# Patient Record
Sex: Female | Born: 1970 | Race: White | Hispanic: No | Marital: Married | Smoking: Never smoker
Health system: Southern US, Community
[De-identification: ages and names within clinical notes are randomized; demographics above are authoritative.]

## PROBLEM LIST (undated history)

## (undated) DIAGNOSIS — N809 Endometriosis, unspecified: Secondary | ICD-10-CM

## (undated) DIAGNOSIS — T7840XA Allergy, unspecified, initial encounter: Secondary | ICD-10-CM

## (undated) DIAGNOSIS — D649 Anemia, unspecified: Secondary | ICD-10-CM

## (undated) DIAGNOSIS — F419 Anxiety disorder, unspecified: Secondary | ICD-10-CM

## (undated) DIAGNOSIS — K219 Gastro-esophageal reflux disease without esophagitis: Secondary | ICD-10-CM

## (undated) HISTORY — DX: Anemia, unspecified: D64.9

## (undated) HISTORY — DX: Anxiety disorder, unspecified: F41.9

## (undated) HISTORY — PX: DILATION AND CURETTAGE OF UTERUS: SHX78

## (undated) HISTORY — PX: TONSILLECTOMY: SUR1361

## (undated) HISTORY — DX: Gastro-esophageal reflux disease without esophagitis: K21.9

## (undated) HISTORY — DX: Allergy, unspecified, initial encounter: T78.40XA

---

## 2005-01-03 ENCOUNTER — Emergency Department: Payer: Self-pay | Admitting: General Practice

## 2006-05-15 ENCOUNTER — Ambulatory Visit: Payer: Self-pay | Admitting: Unknown Physician Specialty

## 2007-03-24 ENCOUNTER — Emergency Department: Payer: Self-pay | Admitting: Emergency Medicine

## 2007-09-30 ENCOUNTER — Ambulatory Visit: Payer: Self-pay

## 2008-02-06 ENCOUNTER — Emergency Department: Payer: Self-pay | Admitting: Emergency Medicine

## 2012-10-30 ENCOUNTER — Emergency Department: Payer: Self-pay | Admitting: Emergency Medicine

## 2012-11-07 ENCOUNTER — Ambulatory Visit: Payer: Self-pay | Admitting: Physician Assistant

## 2012-11-07 LAB — CBC WITH DIFFERENTIAL/PLATELET
Basophil #: 0 10*3/uL (ref 0.0–0.1)
Basophil %: 0.2 %
Eosinophil #: 0 10*3/uL (ref 0.0–0.7)
HGB: 13.3 g/dL (ref 12.0–16.0)
Lymphocyte #: 0.4 10*3/uL — ABNORMAL LOW (ref 1.0–3.6)
Lymphocyte %: 14.3 %
MCH: 28 pg (ref 26.0–34.0)
MCV: 84 fL (ref 80–100)
Neutrophil #: 2.1 10*3/uL (ref 1.4–6.5)
Neutrophil %: 73.7 %
Platelet: 147 10*3/uL — ABNORMAL LOW (ref 150–440)
RBC: 4.75 10*6/uL (ref 3.80–5.20)
WBC: 2.9 10*3/uL — ABNORMAL LOW (ref 3.6–11.0)

## 2016-01-26 ENCOUNTER — Ambulatory Visit
Admission: EM | Admit: 2016-01-26 | Discharge: 2016-01-26 | Disposition: A | Discharge status: Home / Self Care | Payer: No Typology Code available for payment source | Attending: Family Medicine | Admitting: Family Medicine

## 2016-01-26 ENCOUNTER — Encounter: Payer: Self-pay | Admitting: Emergency Medicine

## 2016-01-26 ENCOUNTER — Ambulatory Visit (INDEPENDENT_AMBULATORY_CARE_PROVIDER_SITE_OTHER): Payer: No Typology Code available for payment source

## 2016-01-26 DIAGNOSIS — R109 Unspecified abdominal pain: Secondary | ICD-10-CM | POA: Diagnosis not present

## 2016-01-26 HISTORY — DX: Endometriosis, unspecified: N80.9

## 2016-01-26 LAB — BASIC METABOLIC PANEL
Anion gap: 5 (ref 5–15)
BUN: 13 mg/dL (ref 6–20)
CALCIUM: 8.3 mg/dL — AB (ref 8.9–10.3)
CO2: 25 mmol/L (ref 22–32)
CREATININE: 0.64 mg/dL (ref 0.44–1.00)
Chloride: 103 mmol/L (ref 101–111)
GFR calc non Af Amer: >60 mL/min (ref >60)
Glucose, Bld: 104 mg/dL — ABNORMAL HIGH (ref 65–99)
Potassium: 3.8 mmol/L (ref 3.5–5.1)
Sodium: 133 mmol/L — ABNORMAL LOW (ref 135–145)

## 2016-01-26 LAB — CBC WITH DIFFERENTIAL/PLATELET
BASOS PCT: 1 %
Basophils Absolute: 0.1 10*3/uL (ref 0–0.1)
EOS ABS: 0.1 10*3/uL (ref 0–0.7)
Eosinophils Relative: 1 %
HEMATOCRIT: 38.3 % (ref 35.0–47.0)
Hemoglobin: 13 g/dL (ref 12.0–16.0)
Lymphocytes Relative: 23 %
Lymphs Abs: 2.3 10*3/uL (ref 1.0–3.6)
MCH: 28.6 pg (ref 26.0–34.0)
MCHC: 34 g/dL (ref 32.0–36.0)
MCV: 84.3 fL (ref 80.0–100.0)
MONO ABS: 0.7 10*3/uL (ref 0.2–0.9)
MONOS PCT: 7 %
NEUTROS ABS: 6.8 10*3/uL — AB (ref 1.4–6.5)
Neutrophils Relative %: 68 %
Platelets: 286 10*3/uL (ref 150–440)
RBC: 4.54 MIL/uL (ref 3.80–5.20)
RDW: 13.7 % (ref 11.5–14.5)
WBC: 9.9 10*3/uL (ref 3.6–11.0)

## 2016-01-26 LAB — URINALYSIS COMPLETE WITH MICROSCOPIC (ARMC ONLY)
Bacteria, UA: NONE SEEN
Bilirubin Urine: NEGATIVE
Glucose, UA: NEGATIVE mg/dL
KETONES UR: NEGATIVE mg/dL
LEUKOCYTES UA: NEGATIVE
Nitrite: NEGATIVE
PH: 6.5 (ref 5.0–8.0)
PROTEIN: NEGATIVE mg/dL
Specific Gravity, Urine: 1.02 (ref 1.005–1.030)

## 2016-01-26 MED ORDER — CYCLOBENZAPRINE HCL 5 MG PO TABS: 5.0000 mg | ORAL_TABLET | Freq: Three times a day (TID) | ORAL | Status: DC | PRN

## 2016-01-26 MED ORDER — KETOROLAC TROMETHAMINE 60 MG/2ML IM SOLN
60.0000 mg | Freq: Once | INTRAMUSCULAR | Status: AC
  Administered 2016-01-26: 60 mg via INTRAMUSCULAR

## 2016-01-26 NOTE — ED Notes (Signed)
Patient c/o pain in her left side of her abdomen that goes into her back that started yesterday.  Patient denies fevers.

## 2016-01-26 NOTE — ED Provider Notes (Signed)
[facility]  ____________________________________________  Time seen: Approximately 11:26 AM  I have reviewed the triage vital signs and the nursing notes.   HISTORY  Chief Complaint Abdominal Pain and Back Pain  HPI Brenda Giles is a 45 y.o. female  presents with a complaint of left flank pain. Patient reports pains has been present since yesterday. Patient states that the pain is primarily to left back, left side. States occasionally she'll feel a pain that radiates towards left groin. Denies any other accompanied symptoms. Denies nausea, vomiting, diarrhea, fevers, urinary frequency, urinary urgency, dysuria or vaginal complaints. States has not taken any over-the-counter medications for the same.  Denies known fall or known injury. Patient states that aggravating factors include movement. Patient states that the pain at baseline is a dull achy pain at 2 out of 10 but with intermittent sharp stabbing pain at most 6 out of 10. Patient states that the sharp stabbing pain is only present with movement. Patient states that if sitting still or lying still she does not have any sharp stabbing pains. Patient states bending and twisting causes increased pain. Patient again denies fall or trauma. 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  Denies chest pain, shortness of breath, dizziness, weakness, hearing changes, abdominal pain, dysuria, hematuria, neck pain, back pain, extremity pain or extremity swelling. Denies recent sickness. Denies blood in stool, dark colored stool or blood in toilet. Denies hematuria.   Patient's last menstrual period was 01/21/2016 (exact date). Denies chance of pregnancy.   PCP: [facility].    Past Medical History  Diagnosis Date  . Endometriosis     There are no active  problems to display for this patient.   Past Surgical History  Procedure Laterality Date  . Dilation and curettage of uterus    . Tonsillectomy      Current Outpatient Rx  Name  Route  Sig  Dispense  Refill  . levonorgestrel (MIRENA) 20 MCG/24HR IUD   Intrauterine   1 each by Intrauterine route once.         . vitamin B-12 (CYANOCOBALAMIN) 1000 MCG tablet   Oral   Take 3,000 mcg by mouth daily.         . vitamin C (ASCORBIC ACID) 500 MG tablet   Oral   Take 500 mg by mouth daily.           Allergies Penicillins  History reviewed. No pertinent family history.  Social History Social History  Substance Use Topics  . Smoking status: Never Smoker   . Smokeless tobacco: None  . Alcohol Use: No    Review of Systems Constitutional: No fever/chills Eyes: No visual changes. ENT: No sore throat. Cardiovascular: Denies chest pain. Respiratory: Denies shortness of breath. Gastrointestinal: No abdominal pain.  No nausea, no vomiting.  No diarrhea.  No constipation. Genitourinary: Negative for dysuria. Musculoskeletal: Positive left flank pain.  Skin: Negative for rash. Neurological: Negative for headaches, focal weakness or numbness.  10-point ROS otherwise negative.  ____________________________________________   PHYSICAL EXAM:  VITAL SIGNS: ED Triage Vitals  Enc Vitals Group     BP 01/26/16 1102 115/87 mmHg     Pulse Rate 01/26/16 1102 84     Resp 01/26/16 1102 16     Temp 01/26/16 1102 97.6 F (36.4  C)     Temp Source 01/26/16 1102 Tympanic     SpO2 01/26/16 1102 100 %     Weight 01/26/16 1102 210 lb (95.255 kg)     Height 01/26/16 1102 5\' 5"  (1.651 m)     Head Cir --      Peak Flow --      Pain Score 01/26/16 1105 5     Pain Loc --      Pain Edu? --      Excl. in [place]? --     Constitutional: Alert and oriented. Well appearing and in no acute distress. Eyes: Conjunctivae are normal. PERRL. EOMI. Head: Atraumatic.  Nose: No  congestion/rhinnorhea.  Mouth/Throat: Mucous membranes are moist.  Oropharynx non-erythematous.No tonsillar swelling or exudate. Neck: No stridor.  No cervical spine tenderness to palpation. Hematological/Lymphatic/Immunilogical: No cervical lymphadenopathy. Cardiovascular: Normal rate, regular rhythm. Grossly normal heart sounds.  Good peripheral circulation. Respiratory: Normal respiratory effort.  No retractions. Lungs CTAB. Gastrointestinal: Soft and nontender. Obese abdomen. Normal Bowel sounds.  No right CVA tenderness. Mild left CVA tenderness to palpation,  Musculoskeletal: No lower or upper extremity tenderness nor edema.  No midline cervical, thoracic or lumbar tenderness to palpation. Bilateral pedal pulses equal and easily palpated. left lower lumbar flank mild tenderness to palpation with pain increased with left and right rotation and bending, and patient reports pain is fully reproducible with these movements. Straight leg raises negative bilaterally. Changes positions in room quickly.  Neurologic:  Normal speech and language. No gross focal neurologic deficits are appreciated. No gait instability. Skin:  Skin is warm, dry and intact. No rash noted. Psychiatric: Mood and affect are normal. Speech and behavior are normal.  ____________________________________________   LABS (all labs ordered are listed, but only abnormal results are displayed)  Labs Reviewed  URINALYSIS COMPLETEWITH MICROSCOPIC ([place]) - Abnormal; Notable for the following:    Hgb urine dipstick 2+ (*)    Squamous Epithelial / LPF 0-5 (*)    All other components within normal limits  CBC WITH DIFFERENTIAL/PLATELET - Abnormal; Notable for the following:    Neutro Abs 6.8 (*)    All other components within normal limits  BASIC METABOLIC PANEL - Abnormal; Notable for the following:    Sodium 133 (*)    Glucose, Bld 104 (*)    Calcium 8.3 (*)    All other components within normal limits  URINE CULTURE     RADIOLOGY  EXAM: ABDOMEN - 1 VIEW  COMPARISON: None.  FINDINGS: The bowel gas pattern is normal. Intrauterine device is noted. No renal calculi are noted. Phlebolith is seen in the left side of the pelvis.  IMPRESSION: No evidence of bowel obstruction or ileus. No definite nephrolithiasis is noted.   Electronically Signed By: Marijo Conception, M.D. On: 01/26/2016 12:01  I, Marylene Land, personally viewed and evaluated these images (plain radiographs) as part of my medical decision making, as well as reviewing the written report by the radiologist.    [place] [facility] [place] / ED COURSE  Pertinent labs & imaging results that were available during my care of the patient were reviewed by me and considered in my medical decision making (see chart for details).  Very well-appearing patient. No acute distress. Presents for the complaints of left back pain since yesterday. Denies fall or trauma. Does report some back straining when helping a foster child. Denies fevers or abdominal pain or dysuria. Patient left lower lumbar flank mild tenderness to palpation  with pain increased with left and right rotation and bending, and patient reports pain is fully reproducible with these movements. States minimal pain when sitting still at rest. Suspect muscular strain. Discussed treatment and evaluation with patient patient opts to go forth with evaluation of CBC, BMP, urinalysis and KUB.Toradol 60 mg IM once.   Labs reviewed. WBC count normal.KUB no nephrolithiasis. Suspect muscular strain. Will treat with stretching, ice/heat, rest, and prn flexeril. Patient states the pain is only with movement at this time and mild. Patient states that pain has improved with IM Toradol. Discussed follow up with Primary care physician this week. Discussed follow up and return parameters including no resolution or any worsening concerns. Patient verbalized understanding and agreed to  plan. Discussed indication, risks and benefits of medications with patient.   ____________________________________________   FINAL CLINICAL IMPRESSION(S) / ED DIAGNOSES  Final diagnoses:  Left flank pain      Note: This dictation was prepared with Dragon dictation along with smaller phrase technology. Any transcriptional errors that result from this process are unintentional.    Marylene Land, NP 01/31/16 0920  Marylene Land, NP 01/31/16 1000

## 2016-01-26 NOTE — Discharge Instructions (Signed)
Take medication as prescribed. Rest. Drink plenty of fluids. Stretch. Alternate heat and ice for comfort.    Follow up with your primary care physician this week as needed. Return to Urgent care for new or worsening concerns.    Flank Pain Flank pain refers to pain that is located on the side of the body between the upper abdomen and the back. The pain may occur over a short period of time (acute) or may be long-term or reoccurring (chronic). It may be mild or severe. Flank pain can be caused by many things. CAUSES  Some of the more common causes of flank pain include:  Muscle strains.   Muscle spasms.   A disease of your spine (vertebral disk disease).   A lung infection (pneumonia).   Fluid around your lungs (pulmonary edema).   A kidney infection.   Kidney stones.   A very painful skin rash caused by the chickenpox virus (shingles).   Gallbladder disease.  [place] care will depend on the cause of your pain. In general,  Rest as directed by your caregiver.  Drink enough fluids to keep your urine clear or pale yellow.  Only take over-the-counter or prescription medicines as directed by your caregiver. Some medicines may help relieve the pain.  Tell your caregiver about any changes in your pain.  Follow up with your caregiver as directed. SEEK IMMEDIATE MEDICAL CARE IF:   Your pain is not controlled with medicine.   You have new or worsening symptoms.  Your pain increases.   You have abdominal pain.   You have shortness of breath.   You have persistent nausea or vomiting.   You have swelling in your abdomen.   You feel faint or pass out.   You have blood in your urine.  You have a fever or persistent symptoms for more than 2-3 days.  You have a fever and your symptoms suddenly get worse. MAKE SURE YOU:   Understand these instructions.  Will watch your condition.  Will get help right away if you are not doing well  or get worse.   This information is not intended to replace advice given to you by your health care provider. Make sure you discuss any questions you have with your health care provider.   Document Released: 12/19/2005 Document Revised: 07/22/2012 Document Reviewed: 06/11/2012 Elsevier Interactive Patient Education [employer].

## 2016-07-06 ENCOUNTER — Encounter: Payer: Self-pay | Admitting: Gynecology

## 2016-07-06 ENCOUNTER — Ambulatory Visit
Admission: EM | Admit: 2016-07-06 | Discharge: 2016-07-06 | Disposition: A | Discharge status: Home / Self Care | Payer: No Typology Code available for payment source | Attending: Family Medicine | Admitting: Family Medicine

## 2016-07-06 DIAGNOSIS — L03115 Cellulitis of right lower limb: Secondary | ICD-10-CM | POA: Diagnosis not present

## 2016-07-06 DIAGNOSIS — T148 Other injury of unspecified body region: Secondary | ICD-10-CM

## 2016-07-06 DIAGNOSIS — W57XXXA Bitten or stung by nonvenomous insect and other nonvenomous arthropods, initial encounter: Secondary | ICD-10-CM

## 2016-07-06 MED ORDER — DOXYCYCLINE HYCLATE 100 MG PO CAPS: 100.0000 mg | ORAL_CAPSULE | Freq: Two times a day (BID) | ORAL | 0 refills | Status: DC

## 2016-07-06 NOTE — ED Triage Notes (Signed)
Patient c/o tick bite on right ankle. Per patient blister at bite area.

## 2016-07-06 NOTE — ED Provider Notes (Signed)
CSN: [id number]     Arrival date & time 07/06/16  1356 History   First MD Initiated Contact with Patient 07/06/16 1420     Chief Complaint  Patient presents with  . Insect Bite   (Consider location/radiation/quality/duration/timing/severity/associated sxs/prior Treatment) HPI the patient presents today for evaluation of a right lower extremity tick bite. The tick bite occurred 2 days ago, she was walking when she felt the bite and removed the tick, she states that the ticket was not embedded in her skin. Since then she has developed to small areas of what appears to be a raised blister. Denies any numbness or tingling to the right lower extremity. Denies any joint pain, mild stiffness in the right ankle. Denies any personal history of Lyme disease. Past Medical History:  Diagnosis Date  . Endometriosis    Past Surgical History:  Procedure Laterality Date  . DILATION AND CURETTAGE OF UTERUS    . TONSILLECTOMY     No family history on file. Social History  Substance Use Topics  . Smoking status: Never Smoker  . Smokeless tobacco: Never Used  . Alcohol use No   OB History    No data available     Review of Systems  Constitutional: Negative for fever.  HENT: Negative.   Eyes: Negative.   Respiratory: Negative.   Cardiovascular: Negative.   Gastrointestinal: Negative.   Endocrine: Negative.   Genitourinary: Negative.   Musculoskeletal: Positive for joint swelling.  Skin: Positive for wound.  Allergic/Immunologic: Negative.   Neurological: Negative.   Hematological: Negative.   Psychiatric/Behavioral: Negative.     Allergies  Penicillins  Home Medications   Prior to Admission medications   Medication Sig Start Date End Date Taking? Authorizing Provider  levonorgestrel (MIRENA) 20 MCG/24HR IUD 1 each by Intrauterine route once.   Yes Historical Provider, MD  vitamin B-12 (CYANOCOBALAMIN) 1000 MCG tablet Take 3,000 mcg by mouth daily.   Yes Historical Provider, MD    vitamin C (ASCORBIC ACID) 500 MG tablet Take 500 mg by mouth daily.   Yes Historical Provider, MD  cyclobenzaprine (FLEXERIL) 5 MG tablet Take 1 tablet (5 mg total) by mouth every 8 (eight) hours as needed for muscle spasms (or pain. Do not drive or operate machinery while taking as can cause drowsiness.). 01/26/16   Marylene Land, NP  doxycycline (VIBRAMYCIN) 100 MG capsule Take 1 capsule (100 mg total) by mouth 2 (two) times daily. 07/06/16   Lattie Corns, PA-C   Meds Ordered and Administered this Visit  Medications - No data to display  BP 103/65 (BP Location: Left Arm)   Pulse 83   Temp 98.6 F (37 C) (Tympanic)   Resp 16   Ht 5\' 5"  (1.651 m)   Wt 210 lb (95.3 kg)   LMP 06/25/2016   SpO2 100%   BMI 34.95 kg/m  No data found.   Physical Exam  Constitutional: She appears well-developed and well-nourished. No distress.  Musculoskeletal:       Right ankle: She exhibits normal range of motion. No tenderness. No lateral malleolus and no medial malleolus tenderness found.  Skin: She is not diaphoretic.       Urgent Care Course   Clinical Course    Procedures (including critical care time)  Labs Review Labs Reviewed - No data to display  Imaging Review No results found.  MDM   1. Tick bite   2. Cellulitis of right lower extremity    1.At today's visit, the location does  not appear infected.  Two small pustular areas without drainage or surrounding erythema.  She was given a prescription for doxycycline in case she notices increased redness or drainage. 2.  Encouraged ice and hydrocortisone cream as needed for itching. 3.  If symptoms worsen or persist she can follow-up here at the [facility] or with her PCP.    Lattie Corns, [place] 07/06/16 [phone]

## 2018-05-07 DIAGNOSIS — N943 Premenstrual tension syndrome: Secondary | ICD-10-CM | POA: Insufficient documentation

## 2018-05-07 DIAGNOSIS — Z8739 Personal history of other diseases of the musculoskeletal system and connective tissue: Secondary | ICD-10-CM | POA: Insufficient documentation

## 2018-05-07 DIAGNOSIS — E66811 Obesity, class 1: Secondary | ICD-10-CM | POA: Insufficient documentation

## 2019-03-04 ENCOUNTER — Ambulatory Visit (INDEPENDENT_AMBULATORY_CARE_PROVIDER_SITE_OTHER)
Admit: 2019-03-04 | Discharge: 2019-03-04 | Disposition: A | Discharge status: Home / Self Care | Payer: 59 | Attending: Urgent Care | Admitting: Urgent Care

## 2019-03-04 ENCOUNTER — Other Ambulatory Visit: Payer: Self-pay

## 2019-03-04 ENCOUNTER — Encounter: Payer: Self-pay | Admitting: Emergency Medicine

## 2019-03-04 ENCOUNTER — Ambulatory Visit
Admission: EM | Admit: 2019-03-04 | Discharge: 2019-03-04 | Disposition: A | Discharge status: Home / Self Care | Payer: 59

## 2019-03-04 DIAGNOSIS — R1011 Right upper quadrant pain: Secondary | ICD-10-CM

## 2019-03-04 DIAGNOSIS — R17 Unspecified jaundice: Secondary | ICD-10-CM

## 2019-03-04 DIAGNOSIS — K219 Gastro-esophageal reflux disease without esophagitis: Secondary | ICD-10-CM | POA: Diagnosis not present

## 2019-03-04 DIAGNOSIS — K8020 Calculus of gallbladder without cholecystitis without obstruction: Secondary | ICD-10-CM | POA: Diagnosis not present

## 2019-03-04 LAB — CBC
HCT: 38.5 % (ref 36.0–46.0)
Hemoglobin: 13 g/dL (ref 12.0–15.0)
MCH: 29.4 pg (ref 26.0–34.0)
MCHC: 33.8 g/dL (ref 30.0–36.0)
MCV: 87.1 fL (ref 80.0–100.0)
Platelets: 344 10*3/uL (ref 150–400)
RBC: 4.42 MIL/uL (ref 3.87–5.11)
RDW: 12.9 % (ref 11.5–15.5)
WBC: 8.5 10*3/uL (ref 4.0–10.5)
nRBC: 0 % (ref 0.0–0.2)

## 2019-03-04 LAB — COMPREHENSIVE METABOLIC PANEL
ALT: 14 U/L (ref 0–44)
AST: 20 U/L (ref 15–41)
Albumin: 4.1 g/dL (ref 3.5–5.0)
Alkaline Phosphatase: 41 U/L (ref 38–126)
Anion gap: 9 (ref 5–15)
BUN: 8 mg/dL (ref 6–20)
CO2: 22 mmol/L (ref 22–32)
Calcium: 9 mg/dL (ref 8.9–10.3)
Chloride: 105 mmol/L (ref 98–111)
Creatinine, Ser: 0.77 mg/dL (ref 0.44–1.00)
GFR calc Af Amer: >60 mL/min (ref >60)
GFR calc non Af Amer: >60 mL/min (ref >60)
Glucose, Bld: 106 mg/dL — ABNORMAL HIGH (ref 70–99)
Potassium: 3.7 mmol/L (ref 3.5–5.1)
Sodium: 136 mmol/L (ref 135–145)
Total Bilirubin: 1.4 mg/dL — ABNORMAL HIGH (ref 0.3–1.2)
Total Protein: 8.1 g/dL (ref 6.5–8.1)

## 2019-03-04 LAB — LIPASE, BLOOD: Lipase: 19 U/L (ref 11–51)

## 2019-03-04 MED ORDER — HYDROCODONE-ACETAMINOPHEN 5-325 MG PO TABS: 1.0000 | ORAL_TABLET | Freq: Three times a day (TID) | ORAL | 0 refills | Status: DC | PRN

## 2019-03-04 MED ORDER — ONDANSETRON 4 MG PO TBDP: 4.0000 mg | ORAL_TABLET | Freq: Three times a day (TID) | ORAL | 0 refills | Status: DC | PRN

## 2019-03-04 MED ORDER — PANTOPRAZOLE SODIUM 20 MG PO TBEC: 20.0000 mg | DELAYED_RELEASE_TABLET | Freq: Every day | ORAL | 0 refills | Status: DC

## 2019-03-04 NOTE — ED Provider Notes (Signed)
[address] [place], [place] [address] [phone]   Name: Brenda Giles DOB: [birth year] MRN: [id number] CSN: [id number]  Arrival date and time:  03/04/19 1537  Chief Complaint:  Abdominal Pain (RUQ)  NOTE: Prior to seeing the patient today, I have reviewed the triage nursing documentation and vital signs. Clinical staff has updated patient's PMH/PSHx, current medication list, and drug allergies/intolerances to ensure comprehensive history available to assist in medical decision making.   History:   HPI: Brenda Giles is a 48 y.o. female who presents today with complaints of RIGHY upper quadrant abdominal pain that began with acute onset x 2 days ago. Pain is through and through to her back. She has intermittent referred pain to her RIGHT shoulder. Patient states, "it is like someone took a pole and stuck it right through my upper abdomen". She is experiencing significant nausea with no associated vomiting. She has had a decreased appetite over the last 2 days. Patient last ate "5 saltine crackers and some diet ginger ale" at around 11:00 am today. Patient states, "I am hungry. I want to eat, but when I think about food it makes me sick". Patient denies fevers and changes to her bowel habits. She is unable to attribute symptoms to dietary intake. Additionally, patient notes increased acid reflux symptoms x 2 months.   PMH is not significant for any known gallbladder disease or hepatic pathology. She contacted her insurance provider line earlier today and was advised to present to an [facility] for further evaluation of possible cholecystitis.  Past Medical History:  Diagnosis Date   Endometriosis     Past Surgical History:  Procedure Laterality Date   DILATION AND CURETTAGE OF UTERUS     TONSILLECTOMY      Family History  Problem Relation Age of Onset   Hypertension Mother    Neuropathy Father    Hypertension Father     Social History   Socioeconomic  History   Marital status: Married    Spouse name: Not on file   Number of children: Not on file   Years of education: Not on file   Highest education level: Not on file  Occupational History   Not on file  Social Needs   Financial resource strain: Not on file   Food insecurity:    Worry: Not on file    Inability: Not on file   Transportation needs:    Medical: Not on file    Non-medical: Not on file  Tobacco Use   Smoking status: Never Smoker   Smokeless tobacco: Never Used  Substance and Sexual Activity   Alcohol use: Yes    Comment: occasionally   Drug use: Not Currently   Sexual activity: Yes    Birth control/protection: I.U.D.  Lifestyle   Physical activity:    Days per week: Not on file    Minutes per session: Not on file   Stress: Not on file  Relationships   Social connections:    Talks on phone: Not on file    Gets together: Not on file    Attends religious service: Not on file    Active member of club or organization: Not on file    Attends meetings of clubs or organizations: Not on file    Relationship status: Not on file   Intimate partner violence:    Fear of current or ex partner: Not on file    Emotionally abused: Not on file    Physically abused: Not on  file    Forced sexual activity: Not on file  Other Topics Concern   Not on file  Social History Narrative   Not on file    There are no active problems to display for this patient.   Home Medications:    Current Meds  Medication Sig   buPROPion (WELLBUTRIN XL) 150 MG 24 hr tablet Take by mouth.   Norethindrone-Ethinyl Estradiol-Fe Biphas (LO LOESTRIN FE) 1 MG-10 MCG / 10 MCG tablet Take 1 tablet by mouth daily.    Allergies:   Penicillins  Review of Systems (ROS): Review of Systems  Constitutional: Positive for appetite change (decreased). Negative for chills and fever.  Respiratory: Negative for cough and shortness of breath.   Cardiovascular: Negative for chest  pain and palpitations.  Gastrointestinal: Positive for abdominal pain and nausea. Negative for diarrhea and vomiting.  Skin: Negative for color change and rash.  Neurological: Negative for dizziness, weakness and headaches.  All other systems reviewed and are negative.    Physical Exam:  Triage Vital Signs ED Triage Vitals  Enc Vitals Group     BP 03/04/19 1552 120/73     Pulse Rate 03/04/19 1552 79     Resp 03/04/19 1552 18     Temp 03/04/19 1552 98.4 F (36.9 C)     Temp Source 03/04/19 1552 Oral     SpO2 03/04/19 1552 99 %     Weight 03/04/19 1547 215 lb (97.5 kg)     Height 03/04/19 1547 5\' 5"  (1.651 m)     Head Circumference --      Peak Flow --      Pain Score 03/04/19 1546 6     Pain Loc --      Pain Edu? --      Excl. in [place]? --     Physical Exam  Constitutional: She is oriented to person, place, and time and well-developed, well-nourished, and in no distress.  HENT:  Head: Normocephalic and atraumatic.  Mouth/Throat: Oropharynx is clear and moist and mucous membranes are normal.  Eyes: Pupils are equal, round, and reactive to light. EOM are normal.  Cardiovascular: Normal rate, regular rhythm, normal heart sounds and intact distal pulses. Exam reveals no gallop and no friction rub.  No murmur heard. Pulmonary/Chest: Effort normal and breath sounds normal. No respiratory distress. She has no wheezes. She has no rales.  Abdominal: Soft. Normal appearance and bowel sounds are normal. There is no hepatosplenomegaly. There is abdominal tenderness in the right upper quadrant. There is no rebound, no guarding, no CVA tenderness and negative Murphy's sign.  Neurological: She is alert and oriented to person, place, and time.  Skin: Skin is warm and dry. No rash noted. No erythema.  Psychiatric: Mood, affect and judgment normal.  Nursing note and vitals reviewed.    Urgent Care Treatments / Results:   LABS: PLEASE NOTE: all labs that were ordered this encounter are  listed, however only abnormal results are displayed. Labs Reviewed  COMPREHENSIVE METABOLIC PANEL - Abnormal; Notable for the following components:      Result Value   Glucose, Bld 106 (*)    Total Bilirubin 1.4 (*)    All other components within normal limits  LIPASE, BLOOD  CBC    EKG: No orders found for this or any previous visit.  RADIOLOGY: US Abdomen Limited Ruq  Result Date: 03/04/2019 CLINICAL DATA:  Right upper quadrant pain EXAM: ULTRASOUND ABDOMEN LIMITED RIGHT UPPER QUADRANT COMPARISON:  None. FINDINGS: Gallbladder:  Within the gallbladder, there is an echogenic focus which moves in shadows representing either one large or multiple adherent calculi. This gallstone or collection of gallstones measures 4.0 cm in length. There is no gallbladder wall thickening or pericholecystic fluid. No sonographic Murphy sign noted by sonographer. Common bile duct: Diameter: 4 mm. No intrahepatic or extrahepatic biliary duct dilatation. Liver: No focal lesion identified. Within normal limits in parenchymal echogenicity. Portal vein is patent on color Doppler imaging with normal direction of blood flow towards the liver. IMPRESSION: Cholelithiasis. No appreciable gallbladder wall thickening or pericholecystic fluid. Study otherwise unremarkable. Electronically Signed   By: Lowella Grip III M.D.   On: 03/04/2019 16:42   PRODEDURES: Procedures  MEDICATIONS RECEIVED THIS VISIT: Medications - No data to display Clinical Course as of Mar 03 1718  Thu Mar 04, 2019  1540 Distribution of her pain, coupled with the fact that pain radiates into her back and shoulder, gives rise to concern for gallbladder involvement. She does not have a fever, her VS are stable, and she is non-toxic appearing. She has the following pre-disposing risk factors for gallbladder disease: female sex, age over 10, obesity, and fair complexion. Will proceed with labs and ultrasound imaging.    [BG]    Clinical Course User  Index [BG] Karen Kitchens, NP   Initial Impression / Assessment and Plan / Urgent Care Course:   Pertinent labs & imaging results that were available during my care of the patient were personally reviewed by me and considered in my medical decision making (see chart for details).   Brenda Giles is a 48 y.o. female who presents to [facility] today with complaints of RIGHT upper quadrant abdominal pain that radiates into her back and shoulder. She has significant nausea with no associated vomiting. Appetite has been decreased. Patient has not mounted a fever response associated with her current symptoms.   Exam reveal RIGHT upper quadrant abdominal tenderness. CBC was normal. AST and ALT normal. Total bilirubin minimally elevated at 1.4 mg/dL. Ultrasound imaging was reassuring. She has no active cholecystitis. Wall thickness WNL at 1.9 mm, and there was no pericholecystic fluid collection. CBD measures 4 mm with no intrahepatic or extrahepatic biliary duct dilatation. Note was made of a 4 cm non-obstructing gallstone. Given her pain and nausea, will treat with Norco, ondansetron, and refer to surgery for further evaluation. Additionally, will start patient on low dose PPI (pantoprazole 20 mg) therapy daily to help with her reflux. Discussed that reflux may improved with treatment of cholelithiasis, otherwise she will need to follow up with her PCP for ongoing assessment and management.   Encouraged patient follow up with surgery this week for re-evaluation. Provided her with contact information for Dr. Benjamine Sprague at [facility]. I have reviewed the follow up and strict return precautions for any new or worsening symptoms. Patient is aware of symptoms that would be deemed urgent/emergent, and would thus require further evaluation either here or in the emergency department. At the time of discharge, she verbalized understanding and consent with the discharge plan as it was reviewed with her.  All questions were fielded by provider and/or clinic staff prior to patient discharge.    Final Clinical Impressions(s) / Urgent Care Diagnoses:   Final diagnoses:  RUQ pain  Calculus of gallbladder without cholecystitis without obstruction  Gastroesophageal reflux disease, esophagitis presence not specified  Total bilirubin, elevated    New Prescriptions:   Meds ordered this encounter  Medications  pantoprazole (PROTONIX) 20 MG tablet    Sig: Take 1 tablet (20 mg total) by mouth daily.    Dispense:  30 tablet    Refill:  0   HYDROcodone-acetaminophen (NORCO) 5-325 MG tablet    Sig: Take 1 tablet by mouth every 8 (eight) hours as needed for moderate pain.    Dispense:  12 tablet    Refill:  0   ondansetron (ZOFRAN-ODT) 4 MG disintegrating tablet    Sig: Take 1 tablet (4 mg total) by mouth every 8 (eight) hours as needed for nausea or vomiting.    Dispense:  15 tablet    Refill:  0    Controlled Substance Prescriptions:  [place] Controlled Substance Registry consulted? Yes - I have consulted the [place] Controlled Substances Registry for this patient, and feel the risk/benefit ratio today is favorable for proceeding with this prescription for a controlled substance.  NOTE: This note was prepared using Lobbyist along with smaller Company secretary. Despite my best ability to proofread, there is the potential that transcriptional errors may still occur from this process, and are completely unintentional.     Karen Kitchens, NP 03/04/19 1719

## 2019-03-04 NOTE — Discharge Instructions (Addendum)
It was very nice meeting you today in clinic. Thank you for entrusting me with your care.   As discussed, you have a large gallstone, but your gallbladder is normal. I would like you to discuss options with surgery since it is fairly large. Additionally, I am going to start you on some medicine for your reflux.   Make arrangements to follow up with surgery. I have given you the name of a surgeon today. If your symptoms/condition worsens, please seek follow up care either here or in the ER. Please remember, our [place] providers are "right here with you" when you need [place].   Again, it was my pleasure to take care of you today. Thank you for choosing our clinic. I hope that you start to feel better quickly.   Honor Loh, MSN, APRN, FNP-C, CEN Advanced Practice Provider [facility]

## 2019-03-04 NOTE — ED Triage Notes (Signed)
Pt c/o RUQ abdominal pain radiates through her back and up to her shoulder. She also has nausea. Denies fever. Started yesterday morning. Worse in the morning than at night. She saw tele-doc and told she needed an ultrasound.

## 2019-03-08 ENCOUNTER — Other Ambulatory Visit: Payer: Self-pay | Admitting: Surgery

## 2019-03-08 DIAGNOSIS — R1011 Right upper quadrant pain: Secondary | ICD-10-CM

## 2019-03-09 ENCOUNTER — Ambulatory Visit
Admission: RE | Admit: 2019-03-09 | Discharge: 2019-03-09 | Disposition: A | Discharge status: Home / Self Care | Payer: 59 | Source: Ambulatory Visit | Attending: Surgery | Admitting: Surgery

## 2019-03-09 ENCOUNTER — Other Ambulatory Visit: Payer: Self-pay

## 2019-03-09 ENCOUNTER — Other Ambulatory Visit: Payer: Self-pay | Admitting: Surgery

## 2019-03-09 DIAGNOSIS — R1011 Right upper quadrant pain: Secondary | ICD-10-CM | POA: Insufficient documentation

## 2019-03-09 MED ORDER — MORPHINE SULFATE (PF) 2 MG/ML IV SOLN
3.9000 mg | Freq: Once | INTRAVENOUS | Status: AC
  Administered 2019-03-09: 3.9 mg via INTRAVENOUS

## 2019-03-09 MED ORDER — MORPHINE BOLUS VIA INFUSION
3.9000 mg | Freq: Once | INTRAVENOUS | Status: DC
  Filled 2019-03-09: qty 4

## 2019-03-09 MED ORDER — MORPHINE BOLUS VIA INFUSION: 4.0000 mg | Freq: Once | INTRAVENOUS | Status: DC

## 2019-03-09 MED ORDER — TECHNETIUM TC 99M MEBROFENIN IV KIT
5.0000 | PACK | Freq: Once | INTRAVENOUS | Status: AC | PRN
  Administered 2019-03-09: 5.417 via INTRAVENOUS

## 2019-03-09 MED ORDER — TECHNETIUM TC 99M MEBROFENIN IV KIT
2.0400 | PACK | Freq: Once | INTRAVENOUS | Status: AC | PRN
  Administered 2019-03-09: 2.04 via INTRAVENOUS

## 2019-03-09 MED ORDER — MORPHINE SULFATE (PF) 2 MG/ML IV SOLN
INTRAVENOUS | Status: AC
  Filled 2019-03-09: qty 2

## 2019-03-09 NOTE — Progress Notes (Signed)
Patient identified per policy.  VS stable-see flowsheet.  A&O X4.  Saline lock flushed-patent.  Morphine 3.9 mg IV given per order. Monitoring patient response.

## 2019-03-10 DIAGNOSIS — R197 Diarrhea, unspecified: Secondary | ICD-10-CM | POA: Insufficient documentation

## 2019-03-11 ENCOUNTER — Other Ambulatory Visit: Payer: Self-pay | Admitting: Student

## 2019-03-11 DIAGNOSIS — R110 Nausea: Secondary | ICD-10-CM

## 2019-03-11 DIAGNOSIS — R1084 Generalized abdominal pain: Secondary | ICD-10-CM

## 2019-03-11 DIAGNOSIS — R197 Diarrhea, unspecified: Secondary | ICD-10-CM

## 2019-03-11 DIAGNOSIS — R634 Abnormal weight loss: Secondary | ICD-10-CM

## 2019-03-11 DIAGNOSIS — K30 Functional dyspepsia: Secondary | ICD-10-CM

## 2019-03-12 ENCOUNTER — Other Ambulatory Visit
Admission: RE | Admit: 2019-03-12 | Discharge: 2019-03-12 | Disposition: A | Discharge status: Home / Self Care | Payer: 59 | Source: Ambulatory Visit | Attending: Student | Admitting: Student

## 2019-03-12 DIAGNOSIS — R1084 Generalized abdominal pain: Secondary | ICD-10-CM | POA: Diagnosis not present

## 2019-03-12 DIAGNOSIS — R197 Diarrhea, unspecified: Secondary | ICD-10-CM | POA: Diagnosis present

## 2019-03-12 LAB — GASTROINTESTINAL PANEL BY PCR, STOOL (REPLACES STOOL CULTURE)

## 2019-03-12 LAB — C DIFFICILE QUICK SCREEN W PCR REFLEX??
C Diff antigen: NEGATIVE
C Diff toxin: NEGATIVE

## 2019-03-12 LAB — C DIFFICILE QUICK SCREEN W PCR REFLEX: C Diff interpretation: NOT DETECTED

## 2019-03-15 LAB — CALPROTECTIN, FECAL: Calprotectin, Fecal: 121 ug/g — ABNORMAL HIGH (ref 0–120)

## 2019-03-16 ENCOUNTER — Ambulatory Visit: Payer: 59

## 2019-03-17 LAB — PANCREATIC ELASTASE, FECAL: Pancreatic Elastase-1, Stool: >500 ug Elast./g (ref >200)

## 2019-12-14 DIAGNOSIS — F429 Obsessive-compulsive disorder, unspecified: Secondary | ICD-10-CM | POA: Insufficient documentation

## 2019-12-14 DIAGNOSIS — K529 Noninfective gastroenteritis and colitis, unspecified: Secondary | ICD-10-CM | POA: Insufficient documentation

## 2019-12-14 DIAGNOSIS — F419 Anxiety disorder, unspecified: Secondary | ICD-10-CM | POA: Insufficient documentation

## 2020-05-14 IMAGING — NM NUCLEAR MEDICINE HEPATOBILIARY INCLUDE GB
3 series · 13 of 13 positions shown · non-contrast
Comparison: None.

CLINICAL DATA: Abdominal pain and discomfort x1 week. 4 cm
gallstone on recent ultrasound.

EXAM:
NUCLEAR MEDICINE HEPATOBILIARY IMAGING
TECHNIQUE: Sequential images of the abdomen were obtained [DATE] minutes
following intravenous administration of radiopharmaceutical.
RADIOPHARMACEUTICALS:  5.417 +2.04 mCi 4c-LLm  Choletec IV

[Series 1000: gallbladder delays · 3.30mm/px · 1 of 1 slices shown]
[im 1/1]
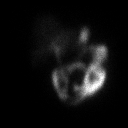

[Series 1000: hepatobiliary scan- with morphine · 9.59mm/px · 6 of 30 frames shown]
[frame 3/30]
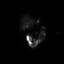
[frame 8/30]
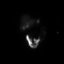
[frame 13/30]
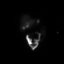
[frame 18/30]
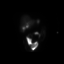
[frame 23/30]
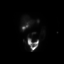
[frame 28/30]
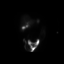

[Series 1000: hepatobiliary scan · 9.59mm/px · 6 of 57 frames shown]
[frame 5/57]
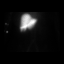
[frame 15/57]
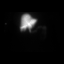
[frame 24/57]
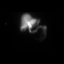
[frame 34/57]
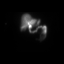
[frame 43/57]
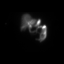
[frame 53/57]
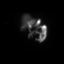

[13 of 13 positions shown; findings below may reference images not displayed]

FINDINGS: Prompt uptake and biliary excretion of activity by the liver is
seen. Biliary activity passes into small bowel, consistent with
patent common bile duct. No convincing gallbladder activity was
identified after 90 minutes of imaging.

Subsequently, weight based morphine sulfate given IV along with
additional Choletec dose, and 25 minutes additional imaging
performed. Increasing activity within the gallbladder was identified
consistent with patent cystic duct.

Ejection fraction not calculated due to absence of normal initial
gallbladder filling.
IMPRESSION: 1. Patency of cystic duct and common bile duct.
2. Administration of morphine sulfate was required to visualize
activity in the gallbladder.

## 2020-09-12 ENCOUNTER — Ambulatory Visit (INDEPENDENT_AMBULATORY_CARE_PROVIDER_SITE_OTHER): Payer: 59 | Admitting: Dermatology

## 2020-09-12 ENCOUNTER — Telehealth: Payer: Self-pay

## 2020-09-12 ENCOUNTER — Other Ambulatory Visit: Payer: Self-pay

## 2020-09-12 DIAGNOSIS — D239 Other benign neoplasm of skin, unspecified: Secondary | ICD-10-CM

## 2020-09-12 DIAGNOSIS — D2372 Other benign neoplasm of skin of left lower limb, including hip: Secondary | ICD-10-CM

## 2020-09-12 DIAGNOSIS — L918 Other hypertrophic disorders of the skin: Secondary | ICD-10-CM

## 2020-09-12 DIAGNOSIS — D1801 Hemangioma of skin and subcutaneous tissue: Secondary | ICD-10-CM

## 2020-09-12 DIAGNOSIS — L814 Other melanin hyperpigmentation: Secondary | ICD-10-CM

## 2020-09-12 DIAGNOSIS — D692 Other nonthrombocytopenic purpura: Secondary | ICD-10-CM

## 2020-09-12 DIAGNOSIS — D1800 Hemangioma unspecified site: Secondary | ICD-10-CM

## 2020-09-12 DIAGNOSIS — D2271 Melanocytic nevi of right lower limb, including hip: Secondary | ICD-10-CM

## 2020-09-12 DIAGNOSIS — Z1283 Encounter for screening for malignant neoplasm of skin: Secondary | ICD-10-CM

## 2020-09-12 DIAGNOSIS — L821 Other seborrheic keratosis: Secondary | ICD-10-CM

## 2020-09-12 DIAGNOSIS — L820 Inflamed seborrheic keratosis: Secondary | ICD-10-CM | POA: Diagnosis not present

## 2020-09-12 DIAGNOSIS — D229 Melanocytic nevi, unspecified: Secondary | ICD-10-CM

## 2020-09-12 DIAGNOSIS — L700 Acne vulgaris: Secondary | ICD-10-CM

## 2020-09-12 DIAGNOSIS — L578 Other skin changes due to chronic exposure to nonionizing radiation: Secondary | ICD-10-CM

## 2020-09-12 NOTE — Progress Notes (Signed)
New Patient Visit  Subjective  Brenda Giles is a 49 y.o. female who presents for the following: Annual Exam (TBSE today. No hx of skin cancer or dysplastic nevi.) and other (pt has a few places of concern today; skin tags around neck and on right side, moles on back and left leg. ). Some are irritating and itchy.  She tend to get acne breakouts on her chin off and on.   Objective  Well appearing patient in no apparent distress; mood and affect are within normal limits.  A full examination was performed including scalp, head, eyes, ears, nose, lips, neck, chest, axillae, abdomen, back, buttocks, bilateral upper extremities, bilateral lower extremities, hands, feet, fingers, toes, fingernails, and toenails. All findings within normal limits unless otherwise noted below.  Objective  Neck x 14, right flank x 1 (15): Fleshy, skin-colored pedunculated papules.    Objective  Right posterior shoulder x 1, left posterior shoulder x 1 (2): Erythematous keratotic or waxy stuck-on papule or plaque.   Objective  Left Knee, left hip (2): Firm pink/brown papulenodule with dimple sign.   Objective  Left Lower calf: 3.5 mm violaceous papule  Objective  Right Lateral Thigh: 4 mm brown macule  Objective  face: Erythematous papules  Objective  bilateral lower cheek and jaw: Diffuse mild erythema with underlying dyspigmentation.   Assessment & Plan  Skin tag (15) Neck x 14, right flank x 1  Discussed that removal may not be covered by insurance. Pt understands and consents to have skin tags removed today.  - Fleshy, skin-colored pedunculated papules at neck and right flank - Benign appearing.  - Patient desires removal. Reviewed that this may not be covered by insurance and they will be charged a fee for removal if not covered.  Patient signed non-covered consent.  - Prior to the procedure, reviewed the expected small wound. Also reviewed the risk of leaving a small scar and the  small risk of infection.  PROCEDURE - The areas were prepped with isopropyl alcohol. A small amount of lidocaine 1% with epinephrine was injected at the base of each lesion to achieve good local anesthesia. The skin tags were removed using a snip technique. Aluminum chloride was used for hemostasis. Petrolatum and a bandage were applied. The procedure was tolerated well. - Wound care was reviewed with the patient. They were advised to call with any concerns. Total number of treated acrochordons 15.   Discussed if not covered by insurance for today's removal could receive bill for higher than $115.   $115 to have up to 15 removed out of pocket if we do not go through insurance for future removal of more skin tags.   Inflamed seborrheic keratosis (2) Right posterior shoulder x 1, left posterior shoulder x 1  Prior to procedure, discussed risks of blister formation, small wound, skin dyspigmentation, or rare scar following cryotherapy.    Destruction of lesion - Right posterior shoulder x 1, left posterior shoulder x 1  Destruction method: cryotherapy   Informed consent: discussed and consent obtained   Lesion destroyed using liquid nitrogen: Yes   Region frozen until ice ball extended beyond lesion: Yes   Outcome: patient tolerated procedure well with no complications   Post-procedure details: wound care instructions given    Dermatofibroma (2) Left Knee, left hip  Benign, observe.    Hemangioma of skin Left Lower calf  Benign, observe.    Nevus Right Lateral Thigh  Benign-appearing.  Observation.  Call clinic for new  or changing moles.  Recommend daily use of broad spectrum spf 30+ sunscreen to sun-exposed areas.    Acne vulgaris face  Discussed Spironolactone to treat hormonal acne, patient defers at this time. Can try OTC Differin 0.1% gel qhs  Spironolactone can cause increased urination and cause blood pressure to decrease. Please watch for signs of lightheadedness  and be cautious when changing position. It can sometimes cause breast tenderness or an irregular period in premenopausal women. It can also increase potassium. The increase in potassium usually is not a concern unless you are taking other medicines that also increase potassium, so please be sure your doctor knows all of the other medications you are taking. This medication should not be taken by pregnant women.  This medicine should also not be taken together with sulfa drugs like Bactrim (trimethoprim/sulfamethexazole).    Actinic skin damage bilateral lower cheek and jaw  Recommend daily broad spectrum sunscreen SPF 30+ to sun-exposed areas, reapply every 2 hours as needed.   Discussed The Perfect Bleaching Cream to dark patches, qd/bid.  Pt may purchase here if desired.    Lentigines - Scattered tan macules - Discussed due to sun exposure - Benign, observe - Call for any changes  Seborrheic Keratoses - Stuck-on, waxy, tan-brown papules and plaques  - Discussed benign etiology and prognosis. - Observe - Call for any changes  Melanocytic Nevi - Tan-brown and/or pink-flesh-colored symmetric macules and papules - Benign appearing on exam today - Observation - Call clinic for new or changing moles - Recommend daily use of broad spectrum spf 30+ sunscreen to sun-exposed areas.   Hemangiomas - Red papules - Discussed benign nature - Observe - Call for any changes   Purpura - Violaceous macules - Benign - Related to age, sun damage and/or use of blood thinners - Observe - Call for worsening or other concerns   Skin cancer screening performed today.   Return in about 1 year (around 09/12/2021) for 1 year TBSE or sooner for more skin tag removal.   I, Brenda Giles, CMA, am acting as scribe for [name], MD.  Documentation: I have reviewed the above documentation for accuracy and completeness, and I agree with the above.  [name] MD

## 2020-09-12 NOTE — Patient Instructions (Addendum)
Seborrheic Keratosis  What causes seborrheic keratoses? Seborrheic keratoses are harmless, common skin growths that first appear during adult life.  As time goes by, more growths appear.  Some people may develop a large number of them.  Seborrheic keratoses appear on both covered and uncovered body parts.  They are not caused by sunlight.  The tendency to develop seborrheic keratoses can be inherited.  They vary in color from skin-colored to gray, brown, or even black.  They can be either smooth or have a rough, warty surface.   Seborrheic keratoses are superficial and look as if they were stuck on the skin.  Under the microscope this type of keratosis looks like layers upon layers of skin.  That is why at times the top layer may seem to fall off, but the rest of the growth remains and re-grows.    Treatment Seborrheic keratoses do not need to be treated, but can easily be removed in the office.  Seborrheic keratoses often cause symptoms when they rub on clothing or jewelry.  Lesions can be in the way of shaving.  If they become inflamed, they can cause itching, soreness, or burning.  Removal of a seborrheic keratosis can be accomplished by freezing, burning, or surgery. If any spot bleeds, scabs, or grows rapidly, please return to have it checked, as these can be an indication of a skin cancer.   Skin Tag Removal Wound Care Instructions  . Sometimes bandages are not necessary.  If a bandage is used, leave the original bandage on for 24 hours if possible.  If the bandage becomes soaked or soiled before that time, it is OK to remove it and examine the wound.  A small amount of post-operative bleeding is normal.  If excessive bleeding occurs, remove the bandage, place gauze over the site and apply continuous pressure (no peeking) over the area for 20-30 minutes.  If this does not stop the bleeding, try again for 40 minutes.  If this does not work, please call our clinic as soon as possible (even if after  hours).    . Once a day, cleanse the wound with soap and water.  If a thick crust develops you may use a Q-tip dipped into dilute hydrogen peroxide (mix 1:1 with water) to dissolve it.  Hydrogen peroxide can slow the healing process, so use it only as needed.  After washing, apply Vaseline jelly or Polysporin ointment.  For best healing, the wound should be covered with a layer of ointment at all times.  This may mean re-applying the ointment several times a day.  For open wounds, continue until it has healed.    . If you have any swelling, keep the area elevated.  . Some redness, tenderness and white or yellow material in the wound is normal healing.  If the area becomes very sore and red, or develops a thick yellow-green material (pus), it may be infected; please notify us.    . Wound healing continues for up to one year following surgery.  It is not unusual to experience pain in the scar from time to time during the interval.  If the pain becomes severe or the scar thickens, you should notify the office.  A slight amount of redness in a scar is expected for the first six months.  After six months, the redness subsides and the scar will soften and fade.  The color difference becomes less noticeable with time.  If there are any problems, return for a post-op  surgery check at your earliest convenience.  . It is important to wear sunscreen daily with an SPF of 30 or higher over the treated sites and to wear sun-protective clothing.  . Please call our office at [phone] for any questions or concerns.

## 2020-09-12 NOTE — Telephone Encounter (Signed)
Called and left vm to schedule new patient appt. Via internet request. EM 09/12/20

## 2021-09-24 ENCOUNTER — Encounter: Payer: 59 | Admitting: Dermatology

## 2022-01-24 ENCOUNTER — Other Ambulatory Visit: Payer: Self-pay | Admitting: Obstetrics and Gynecology

## 2023-04-29 ENCOUNTER — Ambulatory Visit
Admission: EM | Admit: 2023-04-29 | Discharge: 2023-04-29 | Disposition: A | Discharge status: Home / Self Care | Payer: Commercial Managed Care - PPO | Attending: Physician Assistant | Admitting: Physician Assistant

## 2023-04-29 DIAGNOSIS — Z2839 Other underimmunization status: Secondary | ICD-10-CM

## 2023-04-29 DIAGNOSIS — S91331A Puncture wound without foreign body, right foot, initial encounter: Secondary | ICD-10-CM

## 2023-04-29 DIAGNOSIS — Z23 Encounter for immunization: Secondary | ICD-10-CM | POA: Diagnosis not present

## 2023-04-29 MED ORDER — TETANUS-DIPHTH-ACELL PERTUSSIS 5-2.5-18.5 LF-MCG/0.5 IM SUSY
0.5000 mL | PREFILLED_SYRINGE | Freq: Once | INTRAMUSCULAR | Status: AC
  Administered 2023-04-29: 0.5 mL via INTRAMUSCULAR

## 2023-04-29 NOTE — ED Provider Notes (Signed)
[facility]    CSN: [id number] Arrival date & time: 04/29/23  1603      History   Chief Complaint Chief Complaint  Patient presents with   Foot Injury    HPI Brenda Giles is a 52 y.o. female presenting for puncture wound of right heel that occurred at 2 PM today when a nail went through her shoe.  She says the puncture wound is minimal but reports her tetanus immunization is greater than 66 years old and she would like to have that updated today.  She states she cleaned the area and applied alcohol.  Denies any stomach pain.  No bleeding.  No other complaints.  HPI  Past Medical History:  Diagnosis Date   Endometriosis     There are no problems to display for this patient.   Past Surgical History:  Procedure Laterality Date   DILATION AND CURETTAGE OF UTERUS     TONSILLECTOMY      OB History   No obstetric history on file.      Home Medications    Prior to Admission medications   Medication Sig Start Date End Date Taking? Authorizing Provider  sertraline (ZOLOFT) 50 MG tablet daily.   Yes [provider]  buPROPion (WELLBUTRIN XL) 150 MG 24 hr tablet Take by mouth. Patient not taking: Reported on 09/12/2020    [provider]  HYDROcodone-acetaminophen (NORCO) 5-325 MG tablet Take 1 tablet by mouth every 8 (eight) hours as needed for moderate pain. Patient not taking: Reported on 09/12/2020 03/04/19   Verlee Monte, NP  Norethindrone-Ethinyl Estradiol-Fe Biphas (LO LOESTRIN FE) 1 MG-10 MCG / 10 MCG tablet Take 1 tablet by mouth daily.    [provider]  ondansetron (ZOFRAN-ODT) 4 MG disintegrating tablet Take 1 tablet (4 mg total) by mouth every 8 (eight) hours as needed for nausea or vomiting. Patient not taking: Reported on 09/12/2020 03/04/19   Verlee Monte, NP  pantoprazole (PROTONIX) 20 MG tablet Take 1 tablet (20 mg total) by mouth daily. Patient not taking: Reported on 09/12/2020 03/04/19   Verlee Monte, NP     Family History Family History  Problem Relation Age of Onset   Hypertension Mother    Neuropathy Father    Hypertension Father     Social History Social History   Tobacco Use   Smoking status: Never   Smokeless tobacco: Never  Vaping Use   Vaping Use: Never used  Substance Use Topics   Alcohol use: Yes    Comment: occasionally   Drug use: Not Currently     Allergies   Hydrocodone bit-homatrop mbr, Penicillins, and Sumatriptan   Review of Systems Review of Systems  Musculoskeletal:  Negative for arthralgias, gait problem and joint swelling.  Skin:  Positive for wound. Negative for color change.     Physical Exam Triage Vital Signs ED Triage Vitals [04/29/23 1615]  Enc Vitals Group     BP      Pulse      Resp      Temp      Temp src      SpO2      Weight 240 lb (108.9 kg)     Height 5\' 5"  (1.651 m)     Head Circumference      Peak Flow      Pain Score 0     Pain Loc      Pain Edu?      Excl. in GC?  No data found.  Updated Vital Signs BP (!) 140/86 (BP Location: Right Arm)   Pulse 99   Temp 98.5 F (36.9 C) (Oral)   Resp 16   Ht 5\' 5"  (1.651 m)   Wt 240 lb (108.9 kg)   LMP 02/24/2023 (Approximate)   SpO2 96%   BMI 39.94 kg/m     Physical Exam Vitals and nursing note reviewed.  Constitutional:      General: She is not in acute distress.    Appearance: Normal appearance. She is not ill-appearing or toxic-appearing.  HENT:     Head: Normocephalic and atraumatic.  Eyes:     General: No scleral icterus.       Right eye: No discharge.        Left eye: No discharge.     Conjunctiva/sclera: Conjunctivae normal.  Cardiovascular:     Rate and Rhythm: Normal rate.  Pulmonary:     Effort: Pulmonary effort is normal. No respiratory distress.  Musculoskeletal:     Cervical back: Neck supple.  Skin:    General: Skin is dry.     Findings: Wound (Tiny pinpoint puncture of right heel) present.  Neurological:     General: No focal deficit  present.     Mental Status: She is alert. Mental status is at baseline.     Motor: No weakness.     Gait: Gait normal.  Psychiatric:        Mood and Affect: Mood normal.        Behavior: Behavior normal.        Thought Content: Thought content normal.      UC Treatments / Results  Labs (all labs ordered are listed, but only abnormal results are displayed) Labs Reviewed - No data to display  EKG   Radiology No results found.  Procedures Procedures (including critical care time)  Medications Ordered in UC Medications  Tdap (BOOSTRIX) injection 0.5 mL (has no administration in time range)    Initial Impression / Assessment and Plan / UC Course  I have reviewed the triage vital signs and the nursing notes.  Pertinent labs & imaging results that were available during my care of the patient were reviewed by me and considered in my medical decision making (see chart for details).   52 year old female presents for a tiny puncture wound of the right heel that occurred today.  Reports she would like a tetanus immunization as it is greater than 49 years old.  Denies any significant pain.  On exam the puncture wound is minimal.  Discussed wound care guidelines with patient.  Patient given Tdap.  Reviewed returning for any signs of infection.   Final Clinical Impressions(s) / UC Diagnoses   Final diagnoses:  Puncture wound of right foot, initial encounter  Not up to date with tetanus toxoid immunization   Discharge Instructions   None    ED Prescriptions   None    PDMP not reviewed this encounter.   Shirlee Latch, PA-C 04/29/23 1644

## 2023-04-29 NOTE — ED Triage Notes (Signed)
Patient here today due to stepping on a rusty nail this afternoon while working in the yard. Patient states that the nail was small and the puncture wound it small and there is no pain. She cleaned the area well. She would like to update her tetanus.

## 2024-06-15 ENCOUNTER — Other Ambulatory Visit: Payer: Self-pay

## 2024-06-15 ENCOUNTER — Encounter: Payer: Self-pay | Admitting: Family Medicine

## 2024-06-15 ENCOUNTER — Ambulatory Visit (INDEPENDENT_AMBULATORY_CARE_PROVIDER_SITE_OTHER): Admitting: Family Medicine

## 2024-06-15 VITALS — BP 126/80 | HR 80 | Temp 98.2°F | Resp 18 | Ht 65.0 in | Wt 247.0 lb

## 2024-06-15 DIAGNOSIS — Z1211 Encounter for screening for malignant neoplasm of colon: Secondary | ICD-10-CM | POA: Insufficient documentation

## 2024-06-15 DIAGNOSIS — N62 Hypertrophy of breast: Secondary | ICD-10-CM | POA: Diagnosis not present

## 2024-06-15 NOTE — Progress Notes (Signed)
 New Patient Office Visit  Subjective    Patient ID: Brenda Giles, female    DOB: 1971-02-10  Age: 53 y.o. MRN: [id number]  CC:  Chief Complaint  Patient presents with   Establish Care    HPI Brenda Giles presents to establish care  Delightful 53 yo woman here to establish care.    Discussed the use of AI scribe software for clinical note transcription with the patient, who gave verbal consent to proceed.  History of Present Illness   Brenda Giles is a 53 year old female who presents for a routine check-up and discussion of breast reduction.  She has a history of fibrocystic breast changes, monitored through mammograms. She experiences discomfort due to breast size and desires reduction surgery, reporting that her lower back hurts all the time. A mammogram is scheduled for September 2nd.  She takes Zoloft for OCD, characterized by uncontrolled obsessive thinking and worrying. While Zoloft has been effective, recent stressors, including the deaths of her parents and a friend, have made symptom management more challenging. She also takes fish oil and vitamin C, especially since the COVID-19 pandemic.  She has a history of a gallstone, previously evaluated and deemed not significant enough for surgery. She experiences intermittent symptoms but reports long periods without issues.  She is perimenopausal with irregular periods and wonders if they are triggered by stress. She frequently feels hot, a condition shared with other women in her family. No night sweats.  She has a family history of A-fib, dementia, hyperlipidemia and peripheral neuropathy.  She denies a family history of hypertension or diabetes.      Outpatient Encounter Medications as of 06/15/2024  Medication Sig   sertraline (ZOLOFT) 50 MG tablet daily.   [DISCONTINUED] buPROPion (WELLBUTRIN XL) 150 MG 24 hr tablet Take by mouth. (Patient not taking: Reported on 06/15/2024)   [DISCONTINUED]  HYDROcodone -acetaminophen  (NORCO) 5-325 MG tablet Take 1 tablet by mouth every 8 (eight) hours as needed for moderate pain. (Patient not taking: Reported on 06/15/2024)   [DISCONTINUED] Norethindrone-Ethinyl Estradiol-Fe Biphas (LO LOESTRIN FE) 1 MG-10 MCG / 10 MCG tablet Take 1 tablet by mouth daily. (Patient not taking: Reported on 06/15/2024)   [DISCONTINUED] ondansetron  (ZOFRAN -ODT) 4 MG disintegrating tablet Take 1 tablet (4 mg total) by mouth every 8 (eight) hours as needed for nausea or vomiting. (Patient not taking: Reported on 06/15/2024)   [DISCONTINUED] pantoprazole  (PROTONIX ) 20 MG tablet Take 1 tablet (20 mg total) by mouth daily. (Patient not taking: Reported on 06/15/2024)   [DISCONTINUED] valACYclovir (VALTREX) 500 MG tablet Take 500 mg by mouth daily. (Patient not taking: Reported on 06/15/2024)   No facility-administered encounter medications on file as of 06/15/2024.    Past Medical History:  Diagnosis Date   Allergy 1990   Penicillin   Anemia 1991   Anxiety 2020   Endometriosis    GERD (gastroesophageal reflux disease) 2018    Past Surgical History:  Procedure Laterality Date   DILATION AND CURETTAGE OF UTERUS     TONSILLECTOMY      Family History  Problem Relation Age of Onset   Hypertension Mother    Anxiety disorder Mother    Arthritis Mother    Depression Mother    Obesity Mother    Neuropathy Father    Hypertension Father    Obesity Father    Alcohol abuse Maternal Grandfather    Arthritis Maternal Grandmother    Anxiety disorder Sister    Depression Sister  Social History   Socioeconomic History   Marital status: Married    Spouse name: Not on file   Number of children: Not on file   Years of education: Not on file   Highest education level: Bachelor's degree (e.g., BA, AB, BS)  Occupational History   Not on file  Tobacco Use   Smoking status: Never    Passive exposure: Never   Smokeless tobacco: Never  Vaping Use   Vaping status: Never Used   Substance and Sexual Activity   Alcohol use: Not Currently    Comment: occasionally   Drug use: Not Currently   Sexual activity: Yes    Birth control/protection: None  Other Topics Concern   Not on file  Social History Narrative   Not on file   Social Drivers of Health   Financial Resource Strain: Low Risk  (06/14/2024)   Overall Financial Resource Strain (CARDIA)    Difficulty of Paying Living Expenses: Not hard at all  Food Insecurity: No Food Insecurity (06/14/2024)   Hunger Vital Sign    Worried About Running Out of Food in the Last Year: Never true    Ran Out of Food in the Last Year: Never true  Transportation Needs: No Transportation Needs (06/14/2024)   PRAPARE - Administrator, Civil Service (Medical): No    Lack of Transportation (Non-Medical): No  Physical Activity: Unknown (06/14/2024)   Exercise Vital Sign    Days of Exercise per Week: Patient declined    Minutes of Exercise per Session: Not on file  Stress: No Stress Concern Present (06/14/2024)   [employer] of Occupational Health - Occupational Stress Questionnaire    Feeling of Stress: Not at all  Social Connections: Socially Integrated (06/14/2024)   Social Connection and Isolation Panel    Frequency of Communication with Friends and Family: More than three times a week    Frequency of Social Gatherings with Friends and Family: Twice a week    Attends Religious Services: More than 4 times per year    Active Member of [employer] or Organizations: Yes    Attends Engineer, structural: More than 4 times per year    Marital Status: Married  Catering manager Violence: Not on file    ROS      Objective   BP 126/80   Pulse 80   Temp 98.2 F (36.8 C) (Oral)   Resp 18   Ht 5' 5 (1.651 m)   Wt 247 lb (112 kg)   SpO2 97%   BMI 41.10 kg/m    Physical Exam Vitals and nursing note reviewed.  Constitutional:      Appearance: Normal appearance.  HENT:     Head: Normocephalic and  atraumatic.  Eyes:     Conjunctiva/sclera: Conjunctivae normal.  Cardiovascular:     Rate and Rhythm: Normal rate and regular rhythm.  Pulmonary:     Effort: Pulmonary effort is normal.     Breath sounds: Normal breath sounds.  Musculoskeletal:     Right lower leg: No edema.     Left lower leg: No edema.  Skin:    General: Skin is warm and dry.  Neurological:     Mental Status: She is alert and oriented to person, place, and time.  Psychiatric:        Mood and Affect: Mood normal.        Behavior: Behavior normal.        Thought Content: Thought content normal.  Judgment: Judgment normal.            The ASCVD Risk score (Arnett DK, et al., 2019) failed to calculate for the following reasons:   Cannot find a previous HDL lab   Cannot find a previous total cholesterol lab     Assessment & Plan:  Large breasts Assessment & Plan: Will refer the patient to plastic surgery so she can have breast reduction surgery.  She has chronic back pain from her breast.  Orders: -     Ambulatory referral to Plastic Surgery  Screening for colon cancer Assessment & Plan: She has no family history of colon cancer.  This gives her an average risk so she could do a Cologuard which she much prefers  Orders: -     Cologuard    No follow-ups on file.   Affie Gasner K Carmalita Wakefield, MD

## 2024-06-15 NOTE — Assessment & Plan Note (Signed)
 Will refer the patient to plastic surgery so she can have breast reduction surgery.  She has chronic back pain from her breast.

## 2024-06-15 NOTE — Assessment & Plan Note (Signed)
 She has no family history of colon cancer.  This gives her an average risk so she could do a Cologuard which she much prefers

## 2024-06-23 DIAGNOSIS — N951 Menopausal and female climacteric states: Secondary | ICD-10-CM | POA: Diagnosis not present

## 2024-06-29 DIAGNOSIS — Z1211 Encounter for screening for malignant neoplasm of colon: Secondary | ICD-10-CM | POA: Diagnosis not present

## 2024-06-29 DIAGNOSIS — N939 Abnormal uterine and vaginal bleeding, unspecified: Secondary | ICD-10-CM | POA: Diagnosis not present

## 2024-06-29 DIAGNOSIS — Z1231 Encounter for screening mammogram for malignant neoplasm of breast: Secondary | ICD-10-CM | POA: Diagnosis not present

## 2024-06-29 DIAGNOSIS — Z6841 Body Mass Index (BMI) 40.0 and over, adult: Secondary | ICD-10-CM | POA: Diagnosis not present

## 2024-06-29 DIAGNOSIS — B001 Herpesviral vesicular dermatitis: Secondary | ICD-10-CM | POA: Diagnosis not present

## 2024-06-29 DIAGNOSIS — Z01419 Encounter for gynecological examination (general) (routine) without abnormal findings: Secondary | ICD-10-CM | POA: Diagnosis not present

## 2024-06-29 DIAGNOSIS — Z3040 Encounter for surveillance of contraceptives, unspecified: Secondary | ICD-10-CM | POA: Diagnosis not present

## 2024-06-29 DIAGNOSIS — Z1330 Encounter for screening examination for mental health and behavioral disorders, unspecified: Secondary | ICD-10-CM | POA: Diagnosis not present

## 2024-07-13 DIAGNOSIS — Z1231 Encounter for screening mammogram for malignant neoplasm of breast: Secondary | ICD-10-CM | POA: Diagnosis not present

## 2024-08-27 ENCOUNTER — Institutional Professional Consult (permissible substitution): Admitting: Plastic Surgery

## 2024-10-15 ENCOUNTER — Institutional Professional Consult (permissible substitution): Admitting: Plastic Surgery

## 2024-12-21 ENCOUNTER — Institutional Professional Consult (permissible substitution): Admitting: Plastic Surgery
